# Patient Record
Sex: Male | Born: 1940 | Race: White | Hispanic: No | Smoking: Former smoker
Health system: Southern US, Community
[De-identification: ages and names within clinical notes are randomized; demographics above are authoritative.]

## PROBLEM LIST (undated history)

## (undated) DIAGNOSIS — N400 Enlarged prostate without lower urinary tract symptoms: Secondary | ICD-10-CM

## (undated) DIAGNOSIS — I10 Essential (primary) hypertension: Secondary | ICD-10-CM

## (undated) HISTORY — PX: SHOULDER ARTHROSCOPY: SHX128

## (undated) HISTORY — PX: CHOLECYSTECTOMY: SHX55

---

## 2005-01-07 ENCOUNTER — Ambulatory Visit: Payer: Self-pay | Admitting: Unknown Physician Specialty

## 2005-01-20 ENCOUNTER — Ambulatory Visit: Payer: Self-pay | Admitting: Unknown Physician Specialty

## 2005-01-28 ENCOUNTER — Ambulatory Visit: Payer: Self-pay | Admitting: Surgery

## 2006-05-13 IMAGING — NM NUCLEAR MEDICINE HEPATOHBILIARY INCLUDE GB
2 series · 12 of 12 positions shown · non-contrast
Comparison: none

REASON FOR EXAM: Abd Pain Epigastric Pain  US Done January 07, 2005
COMMENTS:

[Series 1: gallbladder dynamic · 4.80mm/px · 6 of 60 frames shown]
[frame 6/60]
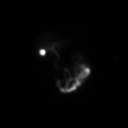
[frame 16/60]
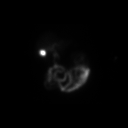
[frame 26/60]
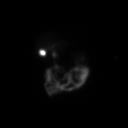
[frame 36/60]
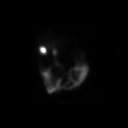
[frame 46/60]
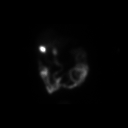
[frame 56/60]
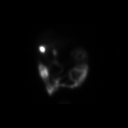

[Series 1: gallbladder dynamic (results) · 4.80mm/px · 6 of 60 frames shown]
[frame 6/60]
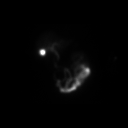
[frame 16/60]
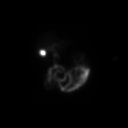
[frame 26/60]
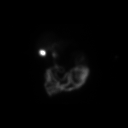
[frame 36/60]
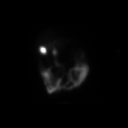
[frame 46/60]
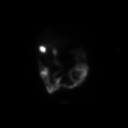
[frame 56/60]
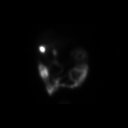

[12 of 12 positions shown; findings below may reference images not displayed]

PROCEDURE:     NM  - NM HEPATO WITH GB EJECT FRACTION  - January 20, 2005 [DATE]

RESULT:       After injection of 7.69 mCi Tc 99m Choletec, multiple gamma
scintiphotos were obtained of the abdomen.  There is noted tracer faintly
appearing in the gallbladder at 45 minutes and within the small bowel at
approximately 45 minutes.  Post injection of Sincalide there is noted a 28%
ejection fraction at 27 minutes.   This is below the normal range.
IMPRESSION: Abnormal gallbladder ejection fraction.

## 2007-04-08 ENCOUNTER — Ambulatory Visit: Payer: Self-pay | Admitting: Emergency Medicine

## 2007-06-16 ENCOUNTER — Ambulatory Visit: Payer: Self-pay | Admitting: Cardiology

## 2007-08-31 ENCOUNTER — Ambulatory Visit: Payer: Self-pay | Admitting: Unknown Physician Specialty

## 2007-11-09 ENCOUNTER — Emergency Department: Payer: Self-pay | Admitting: Emergency Medicine

## 2007-11-19 ENCOUNTER — Ambulatory Visit: Payer: Self-pay | Admitting: Specialist

## 2007-11-23 ENCOUNTER — Ambulatory Visit: Payer: Self-pay | Admitting: Specialist

## 2009-02-01 ENCOUNTER — Ambulatory Visit: Payer: Self-pay | Admitting: Internal Medicine

## 2009-03-01 IMAGING — CR DG HUMERUS 2V *L*
1 series · 2 of 2 positions shown · non-contrast
Comparison: none

REASON FOR EXAM: Pain status post fall, eval for fracture.
COMMENTS:

PROCEDURE:     DXR - DXR HUMERUS LEFT  - November 09, 2007  [DATE]
RESULT:     No acute bony or joint abnormality is identified.

[Series 1: view not recorded · 0.17mm/px · 2 of 2 slices shown]
[im 1/2]
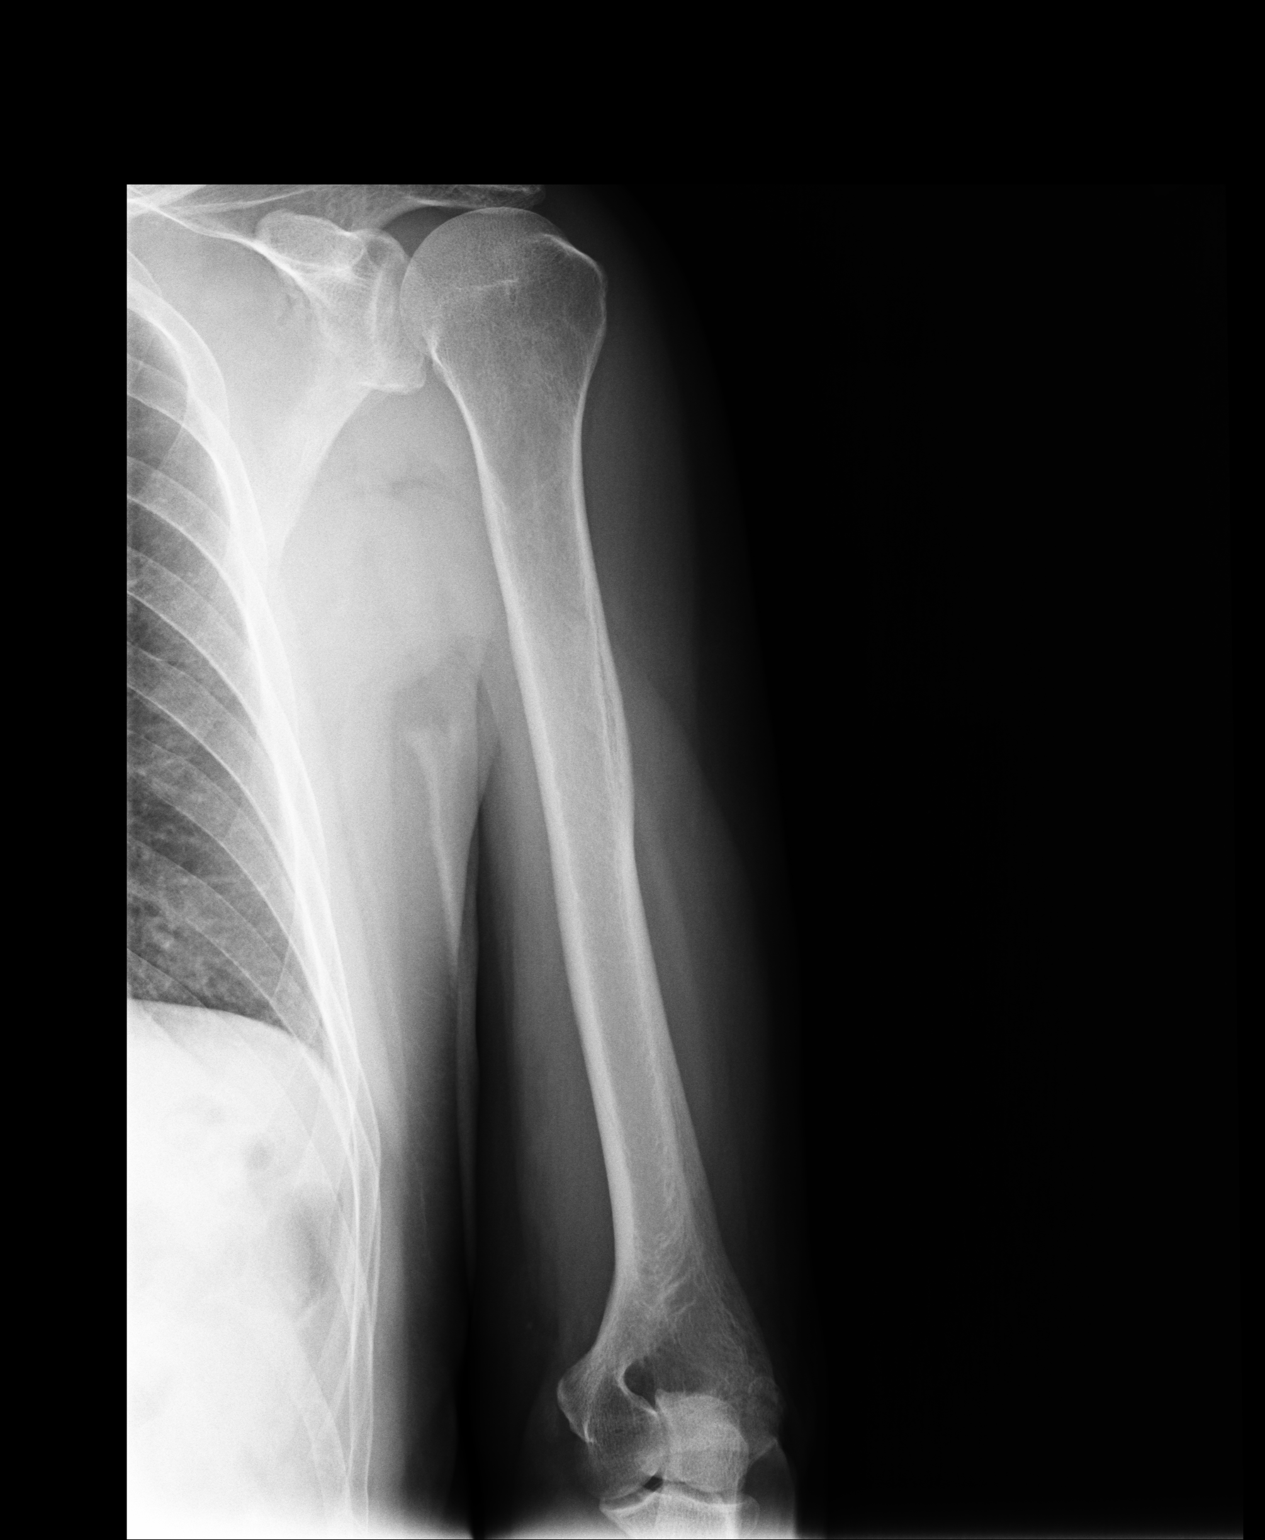
[im 2/2]
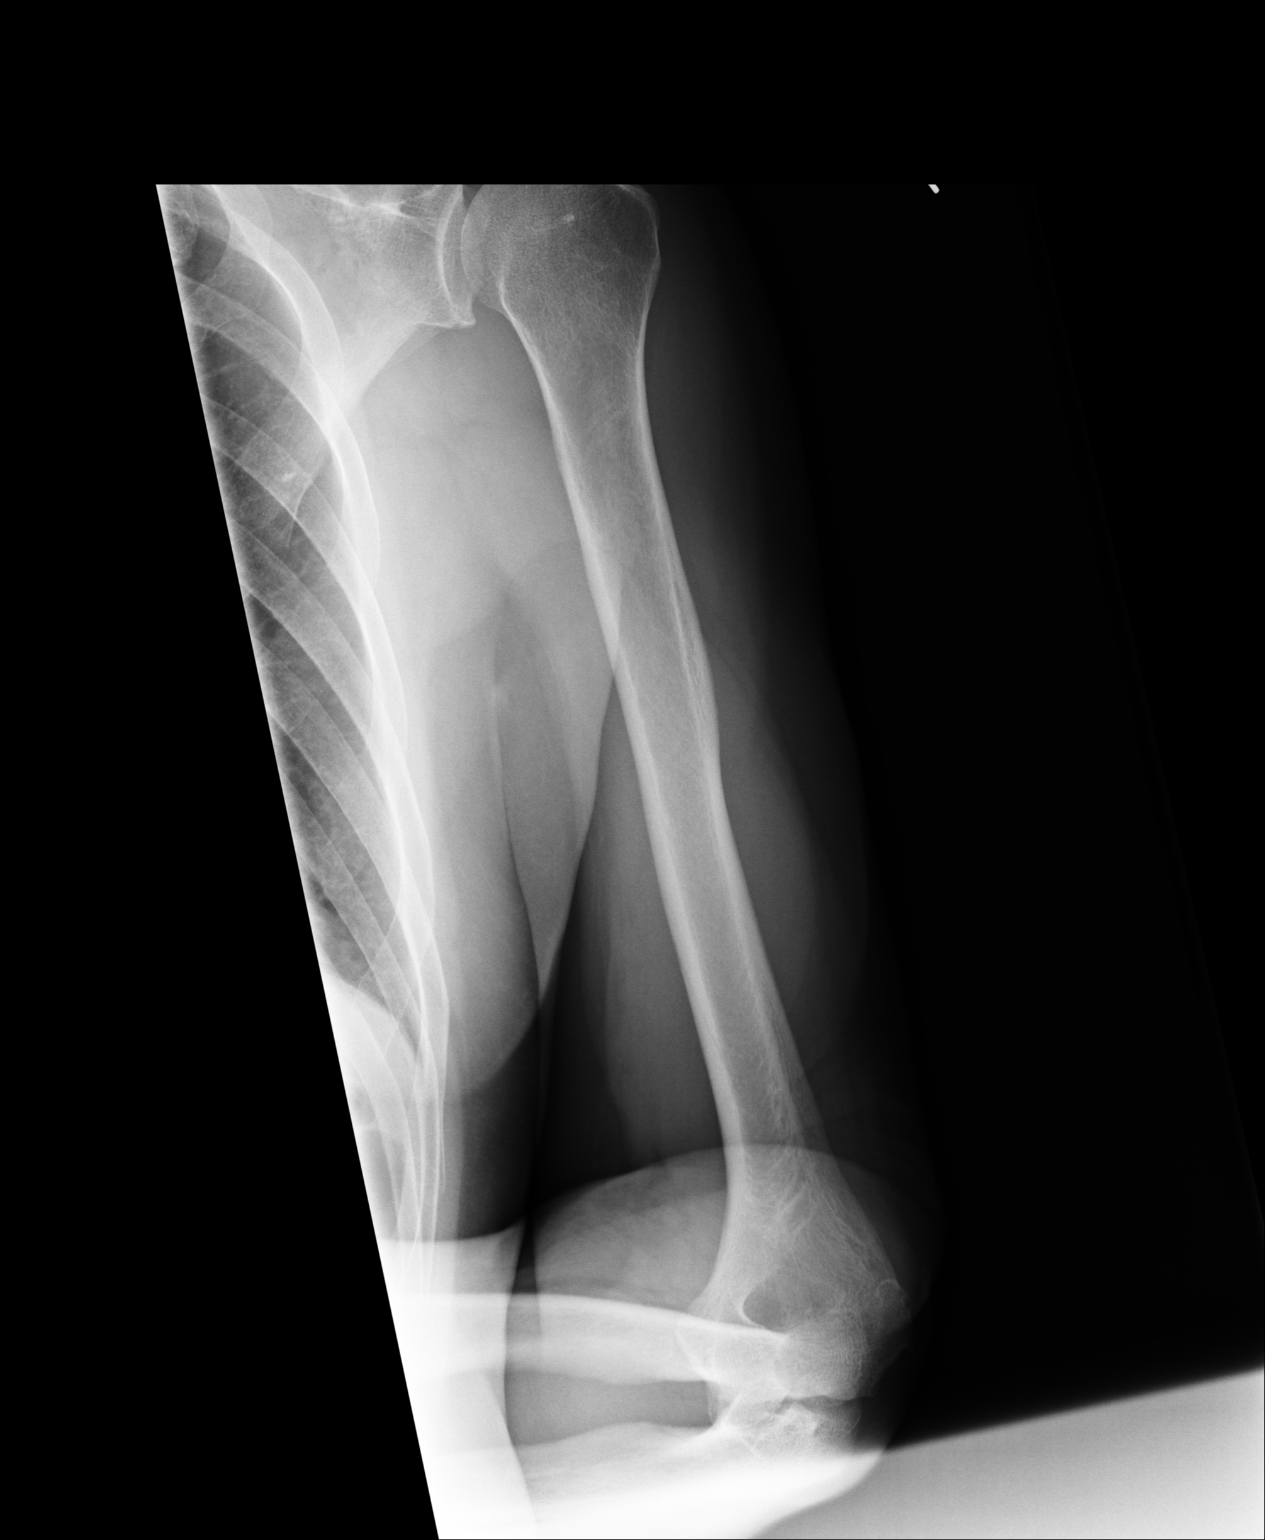

[2 of 2 positions shown; findings below may reference images not displayed]

IMPRESSION: No acute bony or joint abnormalities.

## 2009-03-09 ENCOUNTER — Emergency Department: Payer: Self-pay | Admitting: Internal Medicine

## 2009-06-12 ENCOUNTER — Ambulatory Visit: Payer: Self-pay | Admitting: Family Medicine

## 2009-10-17 ENCOUNTER — Ambulatory Visit: Payer: Self-pay | Admitting: Family Medicine

## 2011-06-23 DIAGNOSIS — M1990 Unspecified osteoarthritis, unspecified site: Secondary | ICD-10-CM | POA: Insufficient documentation

## 2011-10-05 ENCOUNTER — Ambulatory Visit: Payer: Self-pay

## 2011-10-07 ENCOUNTER — Ambulatory Visit: Payer: Self-pay

## 2012-11-19 ENCOUNTER — Ambulatory Visit: Payer: Self-pay | Admitting: Emergency Medicine

## 2013-06-27 ENCOUNTER — Ambulatory Visit: Payer: Self-pay | Admitting: Family Medicine

## 2013-11-15 ENCOUNTER — Ambulatory Visit: Payer: Self-pay | Admitting: Unknown Physician Specialty

## 2013-11-17 LAB — PATHOLOGY REPORT

## 2014-01-09 ENCOUNTER — Ambulatory Visit: Payer: Self-pay | Admitting: Family Medicine

## 2014-04-09 ENCOUNTER — Ambulatory Visit: Payer: Self-pay

## 2014-06-22 DIAGNOSIS — I63511 Cerebral infarction due to unspecified occlusion or stenosis of right middle cerebral artery: Secondary | ICD-10-CM | POA: Insufficient documentation

## 2014-06-22 DIAGNOSIS — M25512 Pain in left shoulder: Secondary | ICD-10-CM | POA: Insufficient documentation

## 2015-04-19 ENCOUNTER — Ambulatory Visit
Admission: EM | Admit: 2015-04-19 | Discharge: 2015-04-19 | Disposition: A | Discharge status: Home / Self Care | Payer: Medicare HMO | Attending: Family Medicine | Admitting: Family Medicine

## 2015-04-19 DIAGNOSIS — L03113 Cellulitis of right upper limb: Secondary | ICD-10-CM

## 2015-04-19 DIAGNOSIS — Z23 Encounter for immunization: Secondary | ICD-10-CM

## 2015-04-19 HISTORY — DX: Essential (primary) hypertension: I10

## 2015-04-19 HISTORY — DX: Benign prostatic hyperplasia without lower urinary tract symptoms: N40.0

## 2015-04-19 MED ORDER — SULFAMETHOXAZOLE-TRIMETHOPRIM 800-160 MG PO TABS: 1.0000 | ORAL_TABLET | Freq: Two times a day (BID) | ORAL | Status: AC

## 2015-04-19 MED ORDER — TETANUS-DIPHTH-ACELL PERTUSSIS 5-2.5-18.5 LF-MCG/0.5 IM SUSP
0.5000 mL | Freq: Once | INTRAMUSCULAR | Status: AC
  Administered 2015-04-19: 0.5 mL via INTRAMUSCULAR

## 2015-04-19 NOTE — ED Provider Notes (Addendum)
CSN: [id number]     Arrival date & time 04/19/15  1919 History   First MD Initiated Contact with Patient 04/19/15 1936     Chief Complaint  Patient presents with  . Cellulitis   (Consider location/radiation/quality/duration/timing/severity/associated sxs/prior Treatment) HPI 74 year old male presents to clinic for evaluation of questionable cellulitis. He sustained an injury from a nail penetrating the medial aspect of his right wrist yesterday and is now experiencing an area of redness and swelling around the wound. He denies any numbness or tingling distally. No loss of function of the wrist or hand. Last tetanus booster was approximately 8 years ago. Past Medical History  Diagnosis Date  . Hypertension   . BPH (benign prostatic hyperplasia)    No past surgical history on file. No family history on file. History  Substance Use Topics  . Smoking status: Former Smoker -- 2.00 packs/day for 40 years    Quit date: 10/25/1990  . Smokeless tobacco: Never Used  . Alcohol Use: Yes     Comment: occasional    Review of Systems A complete 12 point review of systems was performed and are negative with the exception of what is documented in the history of present illness. Allergies  Narcan and Penicillins  Home Medications   Prior to Admission medications   Medication Sig Start Date End Date Taking? Authorizing Provider  aspirin 81 MG tablet Take 81 mg by mouth daily.   Yes Historical Provider, MD  Cyanocobalamin (VITAMIN B-12 IJ) Inject as directed.   Yes Historical Provider, MD  metoprolol succinate (TOPROL-XL) 25 MG 24 hr tablet Take 25 mg by mouth daily.   Yes Historical Provider, MD  omeprazole (PRILOSEC) 20 MG capsule Take 20 mg by mouth daily.   Yes Historical Provider, MD  oxyCODONE-acetaminophen (PERCOCET) 10-325 MG per tablet Take 1 tablet by mouth every 8 (eight) hours as needed for pain.   Yes Historical Provider, MD  tamsulosin (FLOMAX) 0.4 MG CAPS capsule Take 0.4 mg by  mouth.   Yes Historical Provider, MD  traZODone (DESYREL) 50 MG tablet Take 25 mg by mouth at bedtime.   Yes Historical Provider, MD  sulfamethoxazole-trimethoprim (BACTRIM DS,SEPTRA DS) 800-160 MG per tablet Take 1 tablet by mouth 2 (two) times daily. 04/19/15 04/26/15  Carlean Purl, PA-C   BP 126/74 mmHg  Pulse 61  Temp(Src) 97.8 F (36.6 C) (Oral)  Ht 5\' 6"  (1.676 m)  Wt 150 lb (68.04 kg)  BMI 24.22 kg/m2  SpO2 99% Physical Exam Gen.: Well-developed, well-nourished, no acute distress. Behavioral: Alert and oriented 3, answers questions appropriately. Skin: Warm, dry, intact, with the exception of a puncture wound at the medial right wrist with a 6 cm x 8 cm area of erythema and swelling. ED Course  Procedures (including critical care time) Labs Review Labs Reviewed - No data to display  Imaging Review No results found.   MDM   1. Cellulitis of right upper extremity    Plan: 1. diagnosis reviewed with patient 2. rx as per orders; risks, benefits, potential side effects reviewed with patient 3. Recommend supportive treatment with observation to ensure redness is not spreading. 4. F/u prn if symptoms worsen or don't improve     Carlean Purl, PA-C 04/19/15 2001  19:51 Medication Given ED  Tdap (BOOSTRIX) injection 0.5 mL - Dose: 0.5 mL ; Route: Intramuscular ; Site: Left Deltoid ; Scheduled Time: 2000       Carlean Purl, PA-C 06/04/15 1832

## 2015-04-19 NOTE — Discharge Instructions (Signed)
Return if redness is spreading or you develop a fever.  Complete antibiotics in their entirety.

## 2015-04-19 NOTE — ED Notes (Signed)
Pt reports he received an abrasion after hitting his right wrist against a "rusty nail". The affected is painful, erythematous, swollen, as well as warm to the touch. Pt states he received a Td immunization about 8 years ago, and is questioning if he needs another one.

## 2018-01-10 ENCOUNTER — Ambulatory Visit
Admission: EM | Admit: 2018-01-10 | Discharge: 2018-01-10 | Disposition: A | Discharge status: Home / Self Care | Payer: Medicare HMO | Attending: Family Medicine | Admitting: Family Medicine

## 2018-01-10 ENCOUNTER — Other Ambulatory Visit: Payer: Self-pay

## 2018-01-10 ENCOUNTER — Ambulatory Visit (INDEPENDENT_AMBULATORY_CARE_PROVIDER_SITE_OTHER): Payer: Medicare HMO

## 2018-01-10 DIAGNOSIS — M25512 Pain in left shoulder: Secondary | ICD-10-CM | POA: Diagnosis not present

## 2018-01-10 DIAGNOSIS — M25511 Pain in right shoulder: Secondary | ICD-10-CM

## 2018-01-10 DIAGNOSIS — W19XXXA Unspecified fall, initial encounter: Secondary | ICD-10-CM

## 2018-01-10 DIAGNOSIS — S46819A Strain of other muscles, fascia and tendons at shoulder and upper arm level, unspecified arm, initial encounter: Secondary | ICD-10-CM

## 2018-01-10 NOTE — Discharge Instructions (Signed)
Take home medication as prescribed. Rest. Drink plenty of fluids.   Follow up with your primary care physician or orthopedic this week as needed. Return to Urgent care for new or worsening concerns.

## 2018-01-10 NOTE — ED Triage Notes (Signed)
Patient complains of a fall that occurred yesterday at [employer]. Patient states that he fell and put his arms straight out and now complains of bilateral shoulder pain. Patient states that he also has neck pain.

## 2018-01-10 NOTE — ED Provider Notes (Signed)
[facility] ____________________________________________  Time seen: Approximately 9:49 PM  I have reviewed the triage vital signs and the nursing notes.   HISTORY  Chief Complaint Fall and Shoulder Pain (bilateral)   HPI Matthew Hood is a 77 y.o. male presenting for evaluation of bilateral shoulder pain since yesterday after he had a fall.  States he was walking to the car at [employer] parking lot.  States he missed-stepped off of the sidewalk causing him to trip and fall forward.  States he was able to get his hands up and catch himself.  States that he hit his knees first and then his hands.  States since the fall he has had some bilateral shoulder pain.  States he still has normal range of motion for his shoulders, but states as he said previous surgery on both shoulders it was recommended for him to have it evaluated and make sure no fractures.  Denies acute decreased range of motion.  Reports soreness in bilateral shoulders as well as bilateral neck.  Denies any pain to actual neck with movement of neck.  Denies chest pain shortness of breath, syncope, abdominal pain or knee pain.  Denies head injury or loss of consciousness.  Did not hit face on concrete.  His continue remain ambulatory.  States that he did take his chronic oxycodone yesterday and today which helped.  Denies other aggravating or alleviating factors.  Reports otherwise feels well denies other complaints.  Denies recent sickness. Denies recent antibiotic use.   Follows with [facility].  Past Medical History:  Diagnosis Date  . BPH (benign prostatic hyperplasia)   . Hypertension     There are no active problems to display for this patient.   Past Surgical History:  Procedure Laterality Date  . CHOLECYSTECTOMY    . SHOULDER ARTHROSCOPY       No current facility-administered medications for this encounter.   Current Outpatient Medications:  .  aspirin 81 MG tablet, Take 81 mg by mouth  daily., Disp: , Rfl:  .  Cyanocobalamin (VITAMIN B-12 IJ), Inject as directed., Disp: , Rfl:  .  omeprazole (PRILOSEC) 20 MG capsule, Take 20 mg by mouth daily., Disp: , Rfl:  .  oxyCODONE-acetaminophen (PERCOCET) 10-325 MG per tablet, Take 1 tablet by mouth every 8 (eight) hours as needed for pain., Disp: , Rfl:  .  tamsulosin (FLOMAX) 0.4 MG CAPS capsule, Take 0.4 mg by mouth., Disp: , Rfl:  .  traZODone (DESYREL) 50 MG tablet, Take 25 mg by mouth at bedtime., Disp: , Rfl:   Allergies Narcan [naloxone hcl] and Penicillins  Family History  Problem Relation Age of Onset  . Heart disease Mother   . Heart attack Father     Social History Social History   Tobacco Use  . Smoking status: Former Smoker    Packs/day: 2.00    Years: 40.00    Pack years: 80.00    Last attempt to quit: 10/25/1990    Years since quitting: 27.2  . Smokeless tobacco: Never Used  Substance Use Topics  . Alcohol use: Yes    Comment: occasional  . Drug use: No    Review of Systems Constitutional: No fever/chills Cardiovascular: Denies chest pain. Respiratory: Denies shortness of breath. Gastrointestinal: No abdominal pain.   Musculoskeletal: Negative for back pain. As above.  Skin: Negative for rash.   ____________________________________________   PHYSICAL EXAM:  VITAL SIGNS: ED Triage Vitals  Enc Vitals Group     BP 01/10/18 0855 134/69  Pulse Rate 01/10/18 0855 60     Resp 01/10/18 0855 17     Temp 01/10/18 0855 97.6 F (36.4 C)     Temp Source 01/10/18 0855 Oral     SpO2 01/10/18 0855 98 %     Weight 01/10/18 0852 152 lb (68.9 kg)     Height 01/10/18 0852 5\' 6"  (1.676 m)     Head Circumference --      Peak Flow --      Pain Score 01/10/18 0852 5     Pain Loc --      Pain Edu? --      Excl. in GC? --     Constitutional: Alert and oriented. Well appearing and in no acute distress. ENT      Head: Normocephalic and atraumatic. Cardiovascular: Normal rate, regular rhythm.  Grossly normal heart sounds.  Good peripheral circulation. Respiratory: Normal respiratory effort without tachypnea nor retractions. Breath sounds are clear and equal bilaterally. No wheezes, rales, rhonchi. Musculoskeletal:   No midline cervical, thoracic or lumbar tenderness to palpation. Bilateral distal radial pulses equal and easily palpated.  Bilateral upper extremities nontender except for below.  Bilateral hand grip strong and equal.      Right lower leg:  No tenderness or edema.      Left lower leg:  No tenderness or edema.  Except: Left anterior lateral shoulder mild diffuse tenderness to palpation, patient able to abduct to 90 degrees, not much higher, no pain with empty can test, negative drop arm test, patient reports full range of motion for him, no ecchymosis, no obvious deformity, previous surgical scars. Except: Right anterior proximal humerus very mild tenderness to direct palpation, right shoulder otherwise nontender, able to abduct greater than 90 degrees, negative drop arm test, negative empty can test. Except: Bilateral trapezius mild tenderness to palpation, no midline cervical tenderness, and no pain with cervical flexion, extension or rotation. Neurologic:  Normal speech and language. No gross focal neurologic deficits are appreciated. Speech is normal. No gait instability.  Skin:  Skin is warm, dry and intact. No rash noted. Psychiatric: Mood and affect are normal. Speech and behavior are normal. Patient exhibits appropriate insight and judgment   ___________________________________________   LABS (all labs ordered are listed, but only abnormal results are displayed)  Labs Reviewed - No data to display ____________________________________________  RADIOLOGY  Dg Shoulder Right  Result Date: 01/10/2018 CLINICAL DATA:  Pt states he fell yesterday after missing a step. Pain in both shoulders from catching himself. Pt states he is having more ant prox humeral tightness in  bilat shoulder than pain. Hx of mult shoulder surgeries both sides EXAM: RIGHT SHOULDER - 2+ VIEW COMPARISON:  None. FINDINGS: Glenohumeral joint is intact. No evidence of scapular fracture or humeral fracture. The acromioclavicular joint is intact. Tendon anchor noted IMPRESSION: No fracture or dislocation. Electronically Signed   By: Genevive BiStewart  Edmunds M.D.   On: 01/10/2018 10:41   Dg Shoulder Left  Result Date: 01/10/2018 CLINICAL DATA:  Status post fall, left shoulder replacement EXAM: LEFT SHOULDER - 2+ VIEW COMPARISON:  None. FINDINGS: Interval total reverse left shoulder arthroplasty. No hardware failure or complication. Normal alignment. No acute fracture or dislocation. 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 No acute osseous injury of the left shoulder. Electronically Signed   By: Elige KoHetal  Patel   On: 01/10/2018 10:34   ____________________________________________   PROCEDURES Procedures   INITIAL IMPRESSION / ASSESSMENT AND PLAN / ED COURSE  Pertinent labs & imaging results that were available during my care of the patient were reviewed by me and considered in my medical decision making (see chart for details).  Well-appearing patient.  No acute distress.  Patient had a mechanical fall yesterday at [facility].  Catching himself with his arms, now complaining of bilateral shoulder pain in which he has had bilateral previous shoulder surgery.  denies head injury.  Bilateral shoulders x-rayed, results as above and reviewed by myself, no fracture, dislocation or acute osseous changes.  Encourage rest, ice, supportive care and supportive management.  Patient has chronic pain medication at home as needed.  X-ray copies given.  recommend follow-up with orthopedic as needed for continued pain.  Discussed follow up with Primary care physician this week. Discussed follow up and return parameters including no resolution or any worsening concerns. Patient  verbalized understanding and agreed to plan.   ____________________________________________   FINAL CLINICAL IMPRESSION(S) / ED DIAGNOSES  Final diagnoses:  Pain of both shoulder joints  Strain of trapezius muscle, unspecified laterality, initial encounter     ED Discharge Orders    None       Note: This dictation was prepared with Dragon dictation along with smaller phrase technology. Any transcriptional errors that result from this process are unintentional.         Renford Dills, NP 01/10/18 1359

## 2018-07-27 ENCOUNTER — Other Ambulatory Visit: Payer: Self-pay | Admitting: Internal Medicine

## 2018-07-27 DIAGNOSIS — R2242 Localized swelling, mass and lump, left lower limb: Principal | ICD-10-CM

## 2018-07-27 DIAGNOSIS — M7989 Other specified soft tissue disorders: Secondary | ICD-10-CM

## 2018-07-27 DIAGNOSIS — J029 Acute pharyngitis, unspecified: Secondary | ICD-10-CM

## 2019-03-31 ENCOUNTER — Other Ambulatory Visit: Payer: Self-pay | Admitting: Family Medicine

## 2019-03-31 DIAGNOSIS — M5412 Radiculopathy, cervical region: Secondary | ICD-10-CM

## 2019-04-12 ENCOUNTER — Ambulatory Visit: Admission: RE | Admit: 2019-04-12 | Payer: Medicare HMO | Source: Ambulatory Visit

## 2019-05-01 ENCOUNTER — Ambulatory Visit: Payer: Medicare HMO

## 2019-09-18 DIAGNOSIS — G894 Chronic pain syndrome: Secondary | ICD-10-CM | POA: Insufficient documentation

## 2021-08-25 DIAGNOSIS — G629 Polyneuropathy, unspecified: Secondary | ICD-10-CM | POA: Insufficient documentation

## 2022-08-06 DIAGNOSIS — G629 Polyneuropathy, unspecified: Secondary | ICD-10-CM | POA: Insufficient documentation

## 2022-10-01 ENCOUNTER — Other Ambulatory Visit: Payer: Self-pay | Admitting: Otolaryngology

## 2022-10-01 DIAGNOSIS — H9193 Unspecified hearing loss, bilateral: Secondary | ICD-10-CM

## 2022-11-04 DIAGNOSIS — F321 Major depressive disorder, single episode, moderate: Secondary | ICD-10-CM | POA: Insufficient documentation

## 2024-05-11 DIAGNOSIS — M545 Low back pain, unspecified: Secondary | ICD-10-CM | POA: Insufficient documentation

## 2024-07-13 ENCOUNTER — Encounter: Payer: Self-pay | Admitting: Student in an Organized Health Care Education/Training Program

## 2024-07-13 ENCOUNTER — Ambulatory Visit
Attending: Student in an Organized Health Care Education/Training Program | Admitting: Student in an Organized Health Care Education/Training Program

## 2024-07-13 VITALS — BP 131/77 | HR 66 | Temp 98.7°F | Resp 16 | Ht 66.0 in | Wt 140.0 lb

## 2024-07-13 DIAGNOSIS — F321 Major depressive disorder, single episode, moderate: Secondary | ICD-10-CM | POA: Insufficient documentation

## 2024-07-13 DIAGNOSIS — M25512 Pain in left shoulder: Secondary | ICD-10-CM | POA: Insufficient documentation

## 2024-07-13 DIAGNOSIS — G629 Polyneuropathy, unspecified: Secondary | ICD-10-CM | POA: Diagnosis not present

## 2024-07-13 DIAGNOSIS — G8929 Other chronic pain: Secondary | ICD-10-CM | POA: Diagnosis present

## 2024-07-13 DIAGNOSIS — G894 Chronic pain syndrome: Secondary | ICD-10-CM | POA: Diagnosis present

## 2024-07-13 DIAGNOSIS — I639 Cerebral infarction, unspecified: Secondary | ICD-10-CM | POA: Insufficient documentation

## 2024-07-13 NOTE — Progress Notes (Signed)
 Safety precautions to be maintained throughout the outpatient stay will include: orient to surroundings, keep bed in low position, maintain call bell within reach at all times, provide assistance with transfer out of bed and ambulation.

## 2024-07-13 NOTE — Progress Notes (Signed)
 PROVIDER NOTE: Interpretation of information contained herein should be left to medically-trained personnel. Specific patient instructions are provided elsewhere under Patient Instructions section of medical record. This document was created in part using AI and STT-dictation technology, any transcriptional errors that may result from this process are unintentional.  Patient: Matthew Hood  Service: E/M Encounter  Provider: Wallie Sherry, MD  DOB: [birth year]  Delivery: Face-to-face  Specialty: Interventional Pain Management  MRN: [id number]  Setting: Ambulatory outpatient facility  Specialty designation: 09  Type: New Patient  Location: Outpatient office facility  PCP: Matthew Loader, MD  DOS: 07/13/2024    Referring Prov.: Matthew Redbird, MD   Primary Reason(s) for Visit: Encounter for initial evaluation of one or more chronic problems (new to examiner) potentially causing chronic pain, and posing a threat to normal musculoskeletal function. (Level of risk: High) CC: Leg Pain (Bilateral, PN going up towards the knees )  HPI  Matthew Hood is a 83 y.o. year old, male patient, who comes for the first time to our practice referred by Matthew Redbird, MD for our initial evaluation of his chronic pain. He has Acute low back pain without sciatica; Acute right MCA stroke (HCC); Cerebrovascular accident [facility]); Chronic pain syndrome; Current moderate episode of major depressive disorder without prior episode (HCC); Osteoarthritis; Pain in left shoulder; Small fiber neuropathy; and Sensory neuropathy on their problem list. Today he comes in for evaluation of his Leg Pain (Bilateral, PN going up towards the knees )  Pain Assessment: Location: Right, Left Leg Radiating: PN radiating up towards the knees Onset: More than a month ago Duration: Chronic pain Quality: Tingling, Cramping, Discomfort Severity: 5 /10 (subjective, self-reported pain score)  Effect on ADL: sleep disruption Timing:   Modifying factors: pain  medications BP: 131/77  HR: 66  Onset and Duration: Gradual and Present longer than 3 months Cause of pain: Unknown Severity: No change since onset and NAS-11 on the average: 10/10 Timing: Not influenced by the time of the day Aggravating Factors: na Alleviating Factors: Medications Associated Problems: Night-time cramps, Spasms, Tingling, and Pain that wakes patient up Quality of Pain: Aching, Agonizing, Annoying, Deep, Exhausting, and Nagging Previous Examinations or Tests: The patient denies na Previous Treatments: The patient denies NA   Matthew Hood is being evaluated for possible interventional pain management therapies for the treatment of his chronic pain.  Discussed the use of AI scribe software for clinical note transcription with the patient, who gave verbal consent to proceed.  History of Present Illness   Matthew Hood is an 83 year old male who presents for management of chronic leg pain.  He has been experiencing chronic leg pain and was previously under the care of a headache specialist who is retiring in December. He has been using oxycodone for pain management and is attempting to reduce his usage. He currently takes 5 mg of oxycodone once a day, sometimes less, depending on the severity of the pain. The pain is described as severe enough at times to interfere with his activities, stating 'I can't do nothing cause he, he, he gets in my way.'  He has a history of stroke, which occurred around 46. He states that the stroke did not have significant lasting effects, other than slowing him down and making him 'think a little better.'  He mentions taking medication for his stomach since childhood and was referred by his primary doctor to another specialist for an endoscopy to investigate issues related to vitamin and iron absorption. However,  he has not been contacted for the procedure, which was supposed to be scheduled a month ago.       Historic Controlled Substance  Pharmacotherapy Review PMP and historical list of controlled substances:  06/26/2024 06/12/2024  1 Oxycodone-Acetaminophen 10-325 60.00 30 Al Fin [id number] Nor ([id number]) 0/0 30.00 MME Medicare [place]  05/26/2024 05/26/2024  1 Oxycodone-Acetaminophen 10-325 90.00 30 Al Fin [id number] Nor ([id number]) 0/0 45.00 MME Medicare [place]    Historical Monitoring: The patient  reports no history of drug use. List of prior UDS Testing: No results found for: MDMA, COCAINSCRNUR, PCPSCRNUR, PCPQUANT, CANNABQUANT, THCU, ETH, CBDTHCR, D8THCCBX, D9THCCBX Historical Background Evaluation: [place] PMP: PDMP reviewed during this encounter. Review of the past 35-months conducted.              [place] Department of public safety, offender search: Engineer, mining Information) Non-contributory Risk Assessment Profile: Aberrant behavior: None observed or detected today Risk factors for fatal opioid overdose: None identified today Fatal overdose hazard ratio (HR): Calculation deferred Non-fatal overdose hazard ratio (HR): Calculation deferred Risk of opioid abuse or dependence: 0.7-3.0% with doses <= 36 MME/day and 6.1-26% with doses >= 120 MME/day. Substance use disorder (SUD) risk level: Low  Pharmacologic Plan: As per protocol, I have not taken over any controlled substance management, pending the results of ordered tests and/or consults.            Initial impression: Pending review of available data and ordered tests.  Meds   Current Outpatient Medications:    Cyanocobalamin (VITAMIN B-12 IJ), Inject as directed., Disp: , Rfl:    cyanocobalamin (VITAMIN B12) 1000 MCG/ML injection, Inject 1,000 mcg into the muscle every 30 (thirty) days., Disp: , Rfl:    ferrous sulfate 325 (65 FE) MG EC tablet, Take 325 mg by mouth daily with breakfast., Disp: , Rfl:    naloxone (NARCAN) nasal spray 4 mg/0.1 mL, Place 1 spray into the nose once., Disp: , Rfl:    omeprazole (PRILOSEC) 20 MG capsule, Take 20 mg by mouth daily., Disp: , Rfl:     oxyCODONE-acetaminophen (PERCOCET) 10-325 MG per tablet, Take 1 tablet by mouth every 8 (eight) hours as needed for pain., Disp: , Rfl:    tamsulosin (FLOMAX) 0.4 MG CAPS capsule, Take 0.4 mg by mouth., Disp: , Rfl:    traZODone (DESYREL) 50 MG tablet, Take 25 mg by mouth at bedtime., Disp: , Rfl:    aspirin 81 MG tablet, Take 81 mg by mouth daily. (Patient not taking: Reported on 07/13/2024), Disp: , Rfl:   Imaging Review  DG Shoulder Right  Narrative CLINICAL DATA:  Pt states he fell yesterday after missing a step. Pain in both shoulders from catching himself. Pt states he is having more ant prox humeral tightness in bilat shoulder than pain. Hx of mult shoulder surgeries both sides  EXAM: RIGHT SHOULDER - 2+ VIEW  COMPARISON:  None.  FINDINGS: Glenohumeral joint is intact. No evidence of scapular fracture or humeral fracture. The acromioclavicular joint is intact. Tendon anchor noted  IMPRESSION: No fracture or dislocation.   Electronically Signed By: Jackquline Boxer M.D. On: 01/10/2018 10:41    Narrative CLINICAL DATA:  Status post fall, left shoulder replacement  EXAM: LEFT SHOULDER - 2+ VIEW  COMPARISON:  None.  FINDINGS: Interval total reverse left shoulder arthroplasty. No hardware failure or complication. Normal alignment. No acute fracture or dislocation.  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 No acute osseous injury of the left shoulder.  Electronically Signed By: Julaine Blanch On: 01/10/2018 10:34    Complexity Note: Imaging results reviewed.                         ROS  Cardiovascular: No reported cardiovascular signs or symptoms such as High blood pressure, coronary artery disease, abnormal heart rate or rhythm, heart attack, blood thinner therapy or heart weakness and/or failure Pulmonary or Respiratory: No reported pulmonary signs or symptoms such as wheezing and difficulty taking a deep  full breath (Asthma), difficulty blowing air out (Emphysema), coughing up mucus (Bronchitis), persistent dry cough, or temporary stoppage of breathing during sleep Neurological: No reported neurological signs or symptoms such as seizures, abnormal skin sensations, urinary and/or fecal incontinence, being born with an abnormal open spine and/or a tethered spinal cord Psychological-Psychiatric: Difficulty sleeping and or falling asleep Gastrointestinal: Reflux or heatburn Genitourinary: No reported renal or genitourinary signs or symptoms such as difficulty voiding or producing urine, peeing blood, non-functioning kidney, kidney stones, difficulty emptying the bladder, difficulty controlling the flow of urine, or chronic kidney disease Hematological: Weakness due to low blood hemoglobin or red blood cell count (Anemia) Endocrine: No reported endocrine signs or symptoms such as high or low blood sugar, rapid heart rate due to high thyroid levels, obesity or weight gain due to slow thyroid or thyroid disease Rheumatologic: No reported rheumatological signs and symptoms such as fatigue, joint pain, tenderness, swelling, redness, heat, stiffness, decreased range of motion, with or without associated rash Musculoskeletal: Negative for myasthenia gravis, muscular dystrophy, multiple sclerosis or malignant hyperthermia Work History: Retired  Allergies  Mr. Corpus is allergic to narcan [naloxone hcl] and penicillins.  Laboratory Chemistry Profile   Renal No results found for: BUN, CREATININE, LABCREA, BCR, GFR, GFRAA, GFRNONAA, SPECGRAV, PHUR, PROTEINUR   Electrolytes No results found for: NA, K, CL, CALCIUM, MG, PHOS   Hepatic No results found for: AST, ALT, ALBUMIN, ALKPHOS, AMYLASE, LIPASE, AMMONIA   ID No results found for: LYMEIGGIGMAB, HIV, SARSCOV2NAA, STAPHAUREUS, MRSAPCR, HCVAB, PREGTESTUR, RMSFIGG, QFVRPH1IGG, QFVRPH2IGG    Bone No results found for: VD25OH, [id number], CI6874NY7, CI7874NY7, 25OHVITD1, 25OHVITD2, 25OHVITD3, TESTOFREE, TESTOSTERONE   Endocrine No results found for: GLUCOSE, GLUCOSEU, HGBA1C, TSH, FREET4, TESTOFREE, TESTOSTERONE, SHBG, ESTRADIOL, ESTRADIOLPCT, ESTRADIOLFRE, LABPREG, ACTH, CRTSLPL, UCORFRPERLTR, UCORFRPERDAY, CORTISOLBASE   Neuropathy No results found for: VITAMINB12, FOLATE, HGBA1C, HIV   CNS No results found for: COLORCSF, APPEARCSF, RBCCOUNTCSF, WBCCSF, POLYSCSF, LYMPHSCSF, EOSCSF, PROTEINCSF, GLUCCSF, JCVIRUS, CSFOLI, IGGCSF, LABACHR, ACETBL   Inflammation (CRP: Acute  ESR: Chronic) No results found for: CRP, ESRSEDRATE, LATICACIDVEN   Rheumatology No results found for: RF, ANA, LABURIC, URICUR, LYMEIGGIGMAB, LYMEABIGMQN, HLAB27   Coagulation No results found for: INR, LABPROT, APTT, PLT, DDIMER, LABHEMA, VITAMINK1, AT3   Cardiovascular No results found for: BNP, CKTOTAL, CKMB, TROPONINI, HGB, HCT, LABVMA, EPIRU, EPINEPH24HUR, NOREPRU, NOREPI24HUR, DOPARU, DOPAM24HRUR   Screening No results found for: SARSCOV2NAA, COVIDSOURCE, STAPHAUREUS, MRSAPCR, HCVAB, HIV, PREGTESTUR   Cancer No results found for: CEA, CA125, LABCA2   Allergens No results found for: ALMOND, APPLE, ASPARAGUS, AVOCADO, BANANA, BARLEY, BASIL, BAYLEAF, GREENBEAN, LIMABEAN, WHITEBEAN, BEEFIGE, REDBEET, BLUEBERRY, BROCCOLI, CABBAGE, MELON, CARROT, CASEIN, CASHEWNUT, CAULIFLOWER, CELERY     Note: Lab results reviewed.  PFSH  Drug: Mr. Kehl  reports no history of drug use. Alcohol:  reports current alcohol use. Tobacco:  reports that he quit smoking about 33 years ago. His smoking use included cigarettes. He started smoking about 73 years ago. He has a 80 pack-year smoking  history. He has  never used smokeless tobacco. Medical:  has a past medical history of BPH (benign prostatic hyperplasia) and Hypertension. Family: family history includes Heart attack in his father; Heart disease in his mother.  Past Surgical History:  Procedure Laterality Date   CHOLECYSTECTOMY     SHOULDER ARTHROSCOPY     Active Ambulatory Problems    Diagnosis Date Noted   Acute low back pain without sciatica 05/11/2024   Acute right MCA stroke (HCC) 06/22/2014   Cerebrovascular accident (HCC) 07/13/2024   Chronic pain syndrome 09/18/2019   Current moderate episode of major depressive disorder without prior episode (HCC) 11/04/2022   Osteoarthritis 06/23/2011   Pain in left shoulder 06/22/2014   Small fiber neuropathy 08/06/2022   Sensory neuropathy 08/25/2021   Resolved Ambulatory Problems    Diagnosis Date Noted   No Resolved Ambulatory Problems   Past Medical History:  Diagnosis Date   BPH (benign prostatic hyperplasia)    Hypertension    Constitutional Exam  General appearance: Well nourished, well developed, and well hydrated. In no apparent acute distress Vitals:   07/13/24 0852  BP: 131/77  Pulse: 66  Resp: 16  Temp: 98.7 F (37.1 C)  TempSrc: Temporal  SpO2: 100%  Weight: 140 lb (63.5 kg)  Height: 5' 6 (1.676 m)   BMI Assessment: Estimated body mass index is 22.6 kg/m as calculated from the following:   Height as of this encounter: 5' 6 (1.676 m).   Weight as of this encounter: 140 lb (63.5 kg).  BMI interpretation table: BMI level Category Range association with higher incidence of chronic pain  <18 kg/m2 Underweight   18.5-24.9 kg/m2 Ideal body weight   25-29.9 kg/m2 Overweight Increased incidence by 20%  30-34.9 kg/m2 Obese (Class I) Increased incidence by 68%  35-39.9 kg/m2 Severe obesity (Class II) Increased incidence by 136%  >40 kg/m2 Extreme obesity (Class III) Increased incidence by 254%   Patient's current BMI Ideal Body weight  Body mass index is  22.6 kg/m. Ideal body weight: 63.8 kg (140 lb 10.5 oz)   BMI Readings from Last 4 Encounters:  07/13/24 22.60 kg/m  01/10/18 24.53 kg/m  04/19/15 24.21 kg/m   Wt Readings from Last 4 Encounters:  07/13/24 140 lb (63.5 kg)  01/10/18 152 lb (68.9 kg)  04/19/15 150 lb (68 kg)    Psych/Mental status: Alert, oriented x 3 (person, place, & time)       Eyes: PERLA Respiratory: No evidence of acute respiratory distress  Lumbar Spine Area Exam  Skin & Axial Inspection: No masses, redness, or swelling Alignment: Symmetrical Functional ROM: Pain restricted ROM       Stability: No instability detected Muscle Tone/Strength: Functionally intact. No obvious neuro-muscular anomalies detected. Sensory (Neurological): Unimpaired Palpation: No palpable anomalies        Gait & Posture Assessment  Ambulation: Unassisted Gait: Relatively normal for age and body habitus Posture: WNL  Lower Extremity Exam    Side: Right lower extremity  Side: Left lower extremity  Stability: No instability observed          Stability: No instability observed          Skin & Extremity Inspection: Skin color, temperature, and hair growth are WNL. No peripheral edema or cyanosis. No masses, redness, swelling, asymmetry, or associated skin lesions. No contractures.  Skin & Extremity Inspection: Skin color, temperature, and hair growth are WNL. No peripheral edema or cyanosis. No masses, redness, swelling, asymmetry, or associated skin lesions. No contractures.  Functional ROM: Unrestricted ROM                  Functional ROM: Unrestricted ROM                  Muscle Tone/Strength: Functionally intact. No obvious neuro-muscular anomalies detected.  Muscle Tone/Strength: Functionally intact. No obvious neuro-muscular anomalies detected.  Sensory (Neurological): Neurogenic pain pattern        Sensory (Neurological): Neurogenic pain pattern        DTR: Patellar: deferred today Achilles: deferred today Plantar: deferred  today  DTR: Patellar: deferred today Achilles: deferred today Plantar: deferred today  Palpation: No palpable anomalies  Palpation: No palpable anomalies    Assessment  Primary Diagnosis & Pertinent Problem List: The primary encounter diagnosis was Sensory neuropathy. Diagnoses of Small fiber neuropathy, Chronic left shoulder pain, Current moderate episode of major depressive disorder without prior episode (HCC), and Chronic pain syndrome were also pertinent to this visit.  Visit Diagnosis (New problems to examiner): 1. Sensory neuropathy   2. Small fiber neuropathy   3. Chronic left shoulder pain   4. Current moderate episode of major depressive disorder without prior episode (HCC)   5. Chronic pain syndrome    Plan of Care (Initial workup plan)  General Recommendations: The pain condition that the patient suffers from is best treated with a multidisciplinary approach that involves an increase in physical activity to prevent de-conditioning and worsening of the pain cycle, as well as psychological counseling (formal and/or informal) to address the co-morbid psychological affects of pain. Treatment will often involve judicious use of pain medications and interventional procedures to decrease the pain, allowing the patient to participate in the physical activity that will ultimately produce long-lasting pain reductions. The goal of the multidisciplinary approach is to return the patient to a higher level of overall function and to restore their ability to perform activities of daily living.  Patient has been managed with oxycodone for his chronic low back pain, small fiber neuropathy as well as migraines.  His prescribing physician will be retiring at the end of the year and patient is hoping to transition his medication management to a nearby clinic to where he lives.  He states that he takes 5 to 10 mg of oxycodone daily to help manage his pain.  We will obtain a baseline urine toxicology  screen and he will follow-up in January at which point if everything is appropriate we can have him sign a controlled substance agreement and take over medication prescribing.  I informed the patient that prescription will be for Percocet 5 mg up to twice daily as needed but we will discuss further when he follows up in January.  Patient endorsed understanding.   Lab Orders         Compliance Drug Analysis, Ur     Provider-requested follow-up: Return for patient will call to schedule F2F appt prn.  No future appointments. I discussed the assessment and treatment plan with the patient. The patient was provided an opportunity to ask questions and all were answered. The patient agreed with the plan and demonstrated an understanding of the instructions.  Patient advised to call back or seek an in-person evaluation if the symptoms or condition worsens.  I personally spent a total of in the care of the patient today including preparing to see the patient, getting/reviewing separately obtained history, performing a medically appropriate exam/evaluation, counseling and educating, placing orders, and documenting clinical information in the EHR.  Note by: Matthew Sherry, MD (TTS and AI technology used. I apologize for any typographical errors that were not detected and corrected.) Date: 07/13/2024; Time: 10:53 AM

## 2024-07-18 LAB — COMPLIANCE DRUG ANALYSIS, UR

## 2024-10-10 NOTE — Anesthesia Preprocedure Evaluation (Signed)
 Anesthesia Evaluation  Patient identified by MRN, date of birth, ID band Patient awake    Reviewed: Allergy & Precautions, H&P , NPO status , Patient's Chart, lab work & pertinent test results  Airway Mallampati: III  TM Distance: >3 FB Neck ROM: Full    Dental no notable dental hx. (+) Lower Dentures, Upper Dentures   Pulmonary neg pulmonary ROS, former smoker   Pulmonary exam normal breath sounds clear to auscultation       Cardiovascular hypertension, negative cardio ROS Normal cardiovascular exam Rhythm:Regular Rate:Normal  01-14-15 echo INTERPRETATION  NORMAL LEFT VENTRICULAR SYSTOLIC FUNCTION WITH AN ESTIMATED EF = 50 %  NORMAL RIGHT VENTRICULAR SYSTOLIC FUNCTION  MODERATE TRICUSPID VALVE INSUFFICIENCY  MILD MITRAL VALVE INSUFFICIENCY  NO VALVULAR STENOSIS      Neuro/Psych  PSYCHIATRIC DISORDERS  Depression    Hx right MCA stroke 2015,  word finding difficulties at times  Neuromuscular disease CVA negative neurological ROS  negative psych ROS   GI/Hepatic negative GI ROS, Neg liver ROS,,,  Endo/Other  negative endocrine ROS    Renal/GU negative Renal ROS  negative genitourinary   Musculoskeletal negative musculoskeletal ROS (+) Arthritis ,    Abdominal   Peds negative pediatric ROS (+)  Hematology negative hematology ROS (+) Blood dyscrasia, anemia   Anesthesia Other Findings Hypertension BPH (benign prostatic hyperplasia) B12 deficiency Iron deficiency anemia Chronic pain Insomnia Chronic pruritus  Hereditary sensory neuropathy Depression  Low back pain without sciatica Sensory neuropathy  Small fiber neuropathy Disequilibrium syndrome  Acute ischemic right MCA stroke 2015 Mild mitral insufficiency    Reproductive/Obstetrics negative OB ROS                              Anesthesia Physical Anesthesia Plan  ASA: 3  Anesthesia Plan: MAC   Post-op Pain Management:     Induction: Intravenous  PONV Risk Score and Plan:   Airway Management Planned: Natural Airway and Nasal Cannula  Additional Equipment:   Intra-op Plan:   Post-operative Plan:   Informed Consent: I have reviewed the patients History and Physical, chart, labs and discussed the procedure including the risks, benefits and alternatives for the proposed anesthesia with the patient or authorized representative who has indicated his/her understanding and acceptance.     Dental Advisory Given  Plan Discussed with: Anesthesiologist, CRNA and Surgeon  Anesthesia Plan Comments: (Patient consented for risks of anesthesia including but not limited to:  - adverse reactions to medications - damage to eyes, teeth, lips or other oral mucosa - nerve damage due to positioning  - sore throat or hoarseness - Damage to heart, brain, nerves, lungs, other parts of body or loss of life  Patient voiced understanding and assent.)         Anesthesia Quick Evaluation

## 2024-10-10 NOTE — Discharge Instructions (Signed)

## 2024-10-12 ENCOUNTER — Other Ambulatory Visit: Payer: Self-pay

## 2024-10-12 ENCOUNTER — Ambulatory Visit: Admitting: Anesthesiology

## 2024-10-12 ENCOUNTER — Encounter: Admission: RE | Disposition: A | Payer: Self-pay

## 2024-10-12 ENCOUNTER — Ambulatory Visit: Admission: RE | Admit: 2024-10-12 | Discharge: 2024-10-12 | Disposition: A | Discharge status: Home / Self Care

## 2024-10-12 DIAGNOSIS — I10 Essential (primary) hypertension: Secondary | ICD-10-CM | POA: Insufficient documentation

## 2024-10-12 DIAGNOSIS — H2511 Age-related nuclear cataract, right eye: Secondary | ICD-10-CM | POA: Insufficient documentation

## 2024-10-12 DIAGNOSIS — Z87891 Personal history of nicotine dependence: Secondary | ICD-10-CM | POA: Insufficient documentation

## 2024-10-12 DIAGNOSIS — H25041 Posterior subcapsular polar age-related cataract, right eye: Secondary | ICD-10-CM | POA: Diagnosis present

## 2024-10-12 HISTORY — PX: CATARACT EXTRACTION W/PHACO: SHX586

## 2024-10-12 SURGERY — PHACOEMULSIFICATION, CATARACT, WITH IOL INSERTION
Anesthesia: Monitor Anesthesia Care | Site: Eye | Laterality: Right

## 2024-10-12 MED ORDER — SIGHTPATH DOSE#1 BSS IO SOLN
INTRAOCULAR | Status: DC | PRN
  Administered 2024-10-12: 09:00:00 15 mL via INTRAOCULAR

## 2024-10-12 MED ORDER — PHENYLEPHRINE HCL 10 % OP SOLN
1.0000 [drp] | OPHTHALMIC | Status: AC | PRN
  Administered 2024-10-12 (×3): 1 [drp] via OPHTHALMIC

## 2024-10-12 MED ORDER — TETRACAINE HCL 0.5 % OP SOLN
OPHTHALMIC | Status: AC
  Filled 2024-10-12: qty 4

## 2024-10-12 MED ORDER — CYCLOPENTOLATE HCL 2 % OP SOLN
OPHTHALMIC | Status: AC
  Filled 2024-10-12: qty 2

## 2024-10-12 MED ORDER — BRIMONIDINE TARTRATE-TIMOLOL 0.2-0.5 % OP SOLN
OPHTHALMIC | Status: DC | PRN
  Administered 2024-10-12: 09:00:00 1 [drp] via OPHTHALMIC

## 2024-10-12 MED ORDER — ACETAMINOPHEN 325 MG PO TABS
ORAL_TABLET | ORAL | Status: AC
  Filled 2024-10-12: qty 2

## 2024-10-12 MED ORDER — PHENYLEPHRINE HCL 10 % OP SOLN
OPHTHALMIC | Status: AC
  Filled 2024-10-12: qty 5

## 2024-10-12 MED ORDER — CYCLOPENTOLATE HCL 2 % OP SOLN
1.0000 [drp] | OPHTHALMIC | Status: AC | PRN
  Administered 2024-10-12 (×3): 1 [drp] via OPHTHALMIC

## 2024-10-12 MED ORDER — TETRACAINE 0.5 % OP SOLN OPTIME - NO CHARGE
OPHTHALMIC | Status: DC | PRN
  Administered 2024-10-12: 08:00:00 1 [drp] via OPHTHALMIC

## 2024-10-12 MED ORDER — SIGHTPATH DOSE#1 BSS IO SOLN
INTRAOCULAR | Status: DC | PRN
  Administered 2024-10-12: 09:00:00 122 mL via OPHTHALMIC

## 2024-10-12 MED ORDER — LIDOCAINE HCL (PF) 2 % IJ SOLN
INTRAOCULAR | Status: DC | PRN
  Administered 2024-10-12: 09:00:00 4 mL via INTRAOCULAR

## 2024-10-12 MED ORDER — MIDAZOLAM HCL 2 MG/2ML IJ SOLN
INTRAMUSCULAR | Status: AC
  Filled 2024-10-12: qty 2

## 2024-10-12 MED ORDER — PROPOFOL 10 MG/ML IV BOLUS
INTRAVENOUS | Status: AC
  Filled 2024-10-12: qty 20

## 2024-10-12 MED ORDER — PROPOFOL 500 MG/50ML IV EMUL
INTRAVENOUS | Status: DC | PRN
  Administered 2024-10-12: 09:00:00 40 mg via INTRAVENOUS
  Administered 2024-10-12: 09:00:00 20 mg via INTRAVENOUS
  Administered 2024-10-12 (×4): 10 mg via INTRAVENOUS

## 2024-10-12 MED ORDER — EPHEDRINE SULFATE (PRESSORS) 25 MG/5ML IV SOSY
PREFILLED_SYRINGE | INTRAVENOUS | Status: DC | PRN
  Administered 2024-10-12: 09:00:00 5 mg via INTRAVENOUS

## 2024-10-12 MED ORDER — SIGHTPATH DOSE#1 NA HYALUR & NA CHOND-NA HYALUR IO KIT
PACK | INTRAOCULAR | Status: DC | PRN
  Administered 2024-10-12: 09:00:00 1 via OPHTHALMIC

## 2024-10-12 MED ORDER — MIDAZOLAM HCL (PF) 2 MG/2ML IJ SOLN
INTRAMUSCULAR | Status: DC | PRN
  Administered 2024-10-12: 08:00:00 1 mg via INTRAVENOUS
  Administered 2024-10-12 (×2): .5 mg via INTRAVENOUS

## 2024-10-12 MED ORDER — FENTANYL CITRATE (PF) 100 MCG/2ML IJ SOLN
INTRAMUSCULAR | Status: DC | PRN
  Administered 2024-10-12 (×2): 50 ug via INTRAVENOUS

## 2024-10-12 MED ORDER — LACTATED RINGERS IV SOLN: INTRAVENOUS | Status: DC

## 2024-10-12 MED ORDER — FENTANYL CITRATE (PF) 100 MCG/2ML IJ SOLN
INTRAMUSCULAR | Status: AC
  Filled 2024-10-12: qty 2

## 2024-10-12 MED ORDER — SIGHTPATH DOSE#1 NA CHONDROIT SULF-NA HYALURON 20-15 MG/0.5ML IO SOSY
INTRAOCULAR | Status: DC | PRN
  Administered 2024-10-12: 09:00:00 .5 mL via INTRAOCULAR

## 2024-10-12 MED ORDER — EPHEDRINE 5 MG/ML INJ
INTRAVENOUS | Status: AC
  Filled 2024-10-12: qty 5

## 2024-10-12 MED ORDER — TETRACAINE HCL 0.5 % OP SOLN
1.0000 [drp] | OPHTHALMIC | Status: DC | PRN
  Administered 2024-10-12 (×3): 1 [drp] via OPHTHALMIC

## 2024-10-12 MED ORDER — MOXIFLOXACIN HCL 0.5 % OP SOLN
OPHTHALMIC | Status: DC | PRN
  Administered 2024-10-12: 09:00:00 .2 mL via OPHTHALMIC

## 2024-10-12 MED ADMIN — Acetaminophen Tab 325 MG: 650 mg | ORAL | @ 09:00:00 | NDC 50580045811

## 2024-10-12 MED FILL — Fentanyl Citrate Preservative Free (PF) Inj 100 MCG/2ML: INTRAMUSCULAR | Qty: 2 | Status: CN

## 2024-10-12 SURGICAL SUPPLY — 10 items
CANNULA HYDRODISSEC NUC 25X7/8 (MISCELLANEOUS) ×1 IMPLANT
DRSG TEGADERM 2-3/8X2-3/4 SM (GAUZE/BANDAGES/DRESSINGS) ×1 IMPLANT
FEE CATARACT SUITE SIGHTPATH (MISCELLANEOUS) ×1 IMPLANT
GLOVE BIOGEL PI IND STRL 8 (GLOVE) ×1 IMPLANT
GLOVE SURG LX STRL 7.5 STRW (GLOVE) ×1 IMPLANT
GLOVE SURG SYN 6.5 PF PI BL (GLOVE) ×1 IMPLANT
LENS IOL CLRN CLEAR 19.5 (Intraocular Lens) IMPLANT
NDL FILTER BLUNT 18X1 1/2 (NEEDLE) ×1 IMPLANT
NEEDLE FILTER BLUNT 18X1 1/2 (NEEDLE) ×1 IMPLANT
SYR 3ML LL SCALE MARK (SYRINGE) ×1 IMPLANT

## 2024-10-12 NOTE — Anesthesia Postprocedure Evaluation (Signed)
 Anesthesia Post Note  Patient: Matthew Hood  Procedure(s) Performed: PHACOEMULSIFICATION, CATARACT, WITH IOL INSERTION 12.65 01:43.7 (Right: Eye)  Patient location during evaluation: PACU Anesthesia Type: MAC Level of consciousness: awake and alert Pain management: pain level controlled Vital Signs Assessment: post-procedure vital signs reviewed and stable Respiratory status: spontaneous breathing, nonlabored ventilation, respiratory function stable and patient connected to nasal cannula oxygen Cardiovascular status: stable and blood pressure returned to baseline Postop Assessment: no apparent nausea or vomiting Anesthetic complications: no   No notable events documented.   Last Vitals:  Vitals:   10/12/24 0904 10/12/24 0915  BP: 109/70 130/66  Pulse: 73 63  Resp: 14 12  Temp: (!) 36.2 C (!) 36.2 C  SpO2: 96% 97%    Last Pain:  Vitals:   10/12/24 0913  TempSrc:   PainSc: 6                  Blayne Frankie C Maryann Mccall

## 2024-10-12 NOTE — Discharge Instructions (Signed)

## 2024-10-12 NOTE — H&P (Signed)
 [facility]   Primary Care Physician:  Rojelio Loader, MD Ophthalmologist: Dr. Curtistine Fava  Pre-Procedure History & Physical: HPI:  Matthew Hood is a 83 y.o. male here for cataract surgery right eye.   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 Authorizing Provider  ferrous sulfate 325 (65 FE) MG EC tablet Take 325 mg by mouth daily with breakfast.   Yes [provider]  Omega-3 Fatty Acids (FISH OIL) 300 MG CAPS Take by mouth.   Yes [provider]  omeprazole (PRILOSEC) 20 MG capsule Take 20 mg by mouth daily.   Yes [provider]  oxyCODONE-acetaminophen  (PERCOCET) 10-325 MG per tablet Take 1 tablet by mouth every 8 (eight) hours as needed for pain.   Yes [provider]  tamsulosin (FLOMAX) 0.4 MG CAPS capsule Take 0.4 mg by mouth.   Yes [provider]  traZODone (DESYREL) 50 MG tablet Take 25 mg by mouth at bedtime.   Yes [provider]  vitamin B-12 (CYANOCOBALAMIN) 500 MCG tablet Take 500 mcg by mouth daily.   Yes [provider]  aspirin 81 MG tablet Take 81 mg by mouth daily. Patient not taking: Reported on 07/13/2024    [provider]  Cyanocobalamin (VITAMIN B-12 IJ) Inject as directed. Patient not taking: Reported on 10/09/2024    [provider]   cyanocobalamin (VITAMIN B12) 1000 MCG/ML injection Inject 1,000 mcg into the muscle every 30 (thirty) days. Patient not taking: Reported on 10/09/2024    [provider]  naloxone [facility]) nasal spray 4 mg/0.1 mL Place 1 spray into the nose once. 03/31/23   [provider]    Allergies as of 10/03/2024 - Review Complete 07/13/2024  Allergen Reaction Noted   Narcan [naloxone hcl]  04/19/2015   Penicillins  04/19/2015    Family History  Problem Relation Age of Onset   Heart disease Mother    Heart attack Father     Social History   Socioeconomic History   Marital status: Widowed    Spouse name: Not on file   Number of children: Not on file   Years of education: Not on file   Highest education level: Not on file  Occupational History   Not on file  Tobacco Use   Smoking status: Former    Current packs/day: 0.00    Average packs/day: 2.0 packs/day for 40.0 years (80.0 ttl pk-yrs)    Types: Cigarettes    Start date: 10/25/1950    Quit date: 10/25/1990    Years since quitting: 33.9   Smokeless tobacco: Never  Vaping Use   Vaping status: Never Used  Substance and Sexual Activity   Alcohol use: Yes    Comment: occasional  Drug use: No   Sexual activity: Not on file  Other Topics Concern   Not on file  Social History Narrative   Not on file   Social Drivers of Health   Tobacco Use: Medium Risk (10/12/2024)   Patient History    Smoking Tobacco Use: Former    Smokeless Tobacco Use: Never    Passive Exposure: Not on file  Financial Resource Strain: Low Risk  (03/31/2023)   Received from [facility]   Overall Financial Resource Strain (CARDIA)    Difficulty of Paying Living Expenses: Not hard at all  Food Insecurity: No Food Insecurity (03/31/2023)   Received from [facility]   Epic    Within the past 12 months, you worried that your food would run out before you got the money to buy more.: Never true    Within the  past 12 months, the food you bought just didn't last and you didn't have money to get more.: Never true  Transportation Needs: No Transportation Needs (03/31/2023)   Received from [facility]    In the past 12 months, has lack of transportation kept you from medical appointments or from getting medications?: No    Lack of Transportation (Non-Medical): No  Physical Activity: Not on file  Stress: Not on file  Social Connections: Not on file  Intimate Partner Violence: Not on file  Depression (PHQ2-9): Low Risk (07/13/2024)   Depression (PHQ2-9)    PHQ-2 Score: 0  Alcohol Screen: Not on file  Housing: Unknown (03/02/2024)   Received from [facility]   Epic    In the last 12 months, was there a time when you were not able to pay the mortgage or rent on time?: No    Number of Times Moved in the Last Year: Not on file    At any time in the past 12 months, were you homeless or living in a shelter (including now)?: No  Utilities: Not on file  Health Literacy: Not on file    Review of Systems: See HPI, otherwise negative ROS  Physical Exam: BP (!) 147/73   Temp 97.8 F (36.6 C) (Temporal)   Resp 13   Ht 5' 6 (1.676 m)   Wt 67.3 kg   SpO2 100%   BMI 23.95 kg/m  General:   Alert, cooperative in NAD Head:  Normocephalic and atraumatic. Respiratory:  Normal work of breathing. Cardiovascular:  RRR  Impression/Plan: Matthew Hood is here for cataract surgery.  Risks, benefits, limitations, and alternatives regarding cataract surgery have been reviewed with the patient.  Questions have been answered.  All parties agreeable.   Curtistine JINNY Fava, MD  10/12/2024, 8:18 AM

## 2024-10-12 NOTE — Op Note (Signed)
 PREOPERATIVE DIAGNOSIS:  Nuclear sclerotic cataract of the right eye.   POSTOPERATIVE DIAGNOSIS:  Right Eye Cataract   OPERATIVE PROCEDURE: Phacoemulsification with IOL Implant Right Eye   SURGEON:  Curtistine Fava, MD   ANESTHESIA:  Anesthesiologist: Ola Donny BROCKS, MD CRNA: Myra Lawless, CRNA; Liliane Ports, CRNA  Monitored anesthesia care. Topical tetracaine  drops followed by 2% Xylocaine  jelly applied in the preoperative holding area 0.5ml of epi-Shugarcaine was instilled in the eye following the paracentesis.   COMPLICATIONS:  None.   TECHNIQUE:   Phacoemulsification divide and conquer   DESCRIPTION OF PROCEDURE:  The patient was examined and consented in the preoperative holding area where the aforementioned topical anesthesia was applied to the right eye and then brought back to the Operating Room where the right eye was prepped and draped in the usual sterile ophthalmic fashion and a lid speculum was placed. A paracentesis was created with the side port blade and the anterior chamber was filled with epi-Shugarcaine follower by viscoelastic. A clear corneal incision was performed with the steel keratome. A continuous curvilinear capsulorrhexis was performed with a cystotome followed by the capsulorrhexis forceps. Hydrodissection and hydrodelineation were carried out with BSS on a blunt cannula. The lens was removed in a divide and conquer technique and the remaining cortical material was removed with the irrigation-aspiration handpiece. The capsular bag was inflated with viscoelastic and the +19.50 D CC60WF lens was placed in the capsular bag without complication. The remaining viscoelastic was removed from the eye with the irrigation-aspiration handpiece. The wounds were hydrated. The anterior chamber was flushed with BSS and the eye was inflated to physiologic pressure. 0.30ml of Vigamox  was placed in the anterior chamber. The wounds were found to be water tight. The eye was dressed  with Combigan  and covered with a clear shield to be worn until the first postoperative day appointment. The patient was given protective glasses to wear throughout the day. The patient was also given drops with which to begin a drop regimen today and will follow-up with me in one day. Implant Name Type Inv. Item Serial No. Manufacturer Lot No. LRB No. Used Action  LENS IOL CLRN CLEAR 19.5 - [id number] Intraocular Lens LENS IOL CLRN CLEAR 19.5 [id number] [employer]  Right 1 Implanted   Procedures: PHACOEMULSIFICATION, CATARACT, WITH IOL INSERTION 12.65 01:43.7 (Right)  Electronically signed: Curtistine PARAS [facility] 10/12/2024 9:02 AM

## 2024-10-12 NOTE — Transfer of Care (Signed)
 Immediate Anesthesia Transfer of Care Note  Patient: Matthew Hood  Procedure(s) Performed: PHACOEMULSIFICATION, CATARACT, WITH IOL INSERTION 12.65 01:43.7 (Right: Eye)  Patient Location: PACU  Anesthesia Type: MAC  Level of Consciousness: awake, alert  and patient cooperative  Airway and Oxygen Therapy: Patient Spontanous Breathing and Patient connected to supplemental oxygen  Post-op Assessment: Post-op Vital signs reviewed, Patient's Cardiovascular Status Stable, Respiratory Function Stable, Patent Airway and No signs of Nausea or vomiting  Post-op Vital Signs: Reviewed and stable  Complications: No notable events documented.

## 2024-10-13 NOTE — Anesthesia Preprocedure Evaluation (Addendum)
"                                    Anesthesia Evaluation  Patient identified by MRN, date of birth, ID band Patient awake    Reviewed: Allergy & Precautions, H&P , NPO status , Patient's Chart, lab work & pertinent test results  Airway Mallampati: III  TM Distance: >3 FB Neck ROM: Full    Dental no notable dental hx. (+) Upper Dentures, Lower Dentures   Pulmonary former smoker   Pulmonary exam normal breath sounds clear to auscultation       Cardiovascular hypertension, Normal cardiovascular exam Rhythm:Regular Rate:Normal  01-14-15 echo INTERPRETATION  NORMAL LEFT VENTRICULAR SYSTOLIC FUNCTION WITH AN ESTIMATED EF = 50 %  NORMAL RIGHT VENTRICULAR SYSTOLIC FUNCTION  MODERATE TRICUSPID VALVE INSUFFICIENCY  MILD MITRAL VALVE INSUFFICIENCY  NO VALVULAR STENOSIS         Neuro/Psych  PSYCHIATRIC DISORDERS  Depression     Hx right MCA stroke 2015,  word finding difficulties at times  Neuromuscular disease CVA negative neurological ROS  negative psych ROS   GI/Hepatic negative GI ROS, Neg liver ROS,,,  Endo/Other  negative endocrine ROS    Renal/GU negative Renal ROS  negative genitourinary   Musculoskeletal negative musculoskeletal ROS (+) Arthritis ,    Abdominal   Peds negative pediatric ROS (+)  Hematology negative hematology ROS (+) Blood dyscrasia, anemia   Anesthesia Other Findings Previous cataract surgery 10-12-24 Dr. Ola  Hypertension BPH (benign prostatic hyperplasia) B12 deficiency Iron deficiency anemia Chronic pain Insomnia Chronic pruritus            Hereditary sensory neuropathy Depression             Low back pain without sciatica Sensory neuropathy      Small fiber neuropathy Disequilibrium syndrome        Acute ischemic right MCA stroke 2015 Mild mitral insufficiency   Reproductive/Obstetrics negative OB ROS                              Anesthesia Physical Anesthesia Plan  ASA:  3  Anesthesia Plan: MAC   Post-op Pain Management:    Induction: Intravenous  PONV Risk Score and Plan:   Airway Management Planned: Natural Airway and Nasal Cannula  Additional Equipment:   Intra-op Plan:   Post-operative Plan:   Informed Consent: I have reviewed the patients History and Physical, chart, labs and discussed the procedure including the risks, benefits and alternatives for the proposed anesthesia with the patient or authorized representative who has indicated his/her understanding and acceptance.     Dental Advisory Given  Plan Discussed with: Anesthesiologist, CRNA and Surgeon  Anesthesia Plan Comments: (Patient consented for risks of anesthesia including but not limited to:  - adverse reactions to medications - damage to eyes, teeth, lips or other oral mucosa - nerve damage due to positioning  - sore throat or hoarseness - Damage to heart, brain, nerves, lungs, other parts of body or loss of life  Patient voiced understanding and assent.)         Anesthesia Quick Evaluation  "

## 2024-10-23 ENCOUNTER — Ambulatory Visit: Payer: Self-pay | Admitting: Anesthesiology

## 2024-10-23 ENCOUNTER — Ambulatory Visit: Admission: RE | Admit: 2024-10-23 | Discharge: 2024-10-23 | Disposition: A | Discharge status: Home / Self Care

## 2024-10-23 ENCOUNTER — Other Ambulatory Visit: Payer: Self-pay

## 2024-10-23 ENCOUNTER — Encounter: Admission: RE | Disposition: A | Discharge status: Home / Self Care | Payer: Self-pay | Source: Home / Self Care

## 2024-10-23 DIAGNOSIS — Z87891 Personal history of nicotine dependence: Secondary | ICD-10-CM | POA: Diagnosis not present

## 2024-10-23 DIAGNOSIS — I69318 Other symptoms and signs involving cognitive functions following cerebral infarction: Secondary | ICD-10-CM | POA: Diagnosis not present

## 2024-10-23 DIAGNOSIS — I10 Essential (primary) hypertension: Secondary | ICD-10-CM | POA: Diagnosis not present

## 2024-10-23 DIAGNOSIS — H2512 Age-related nuclear cataract, left eye: Secondary | ICD-10-CM | POA: Diagnosis present

## 2024-10-23 DIAGNOSIS — F32A Depression, unspecified: Secondary | ICD-10-CM | POA: Diagnosis not present

## 2024-10-23 HISTORY — PX: CATARACT EXTRACTION W/PHACO: SHX586

## 2024-10-23 SURGERY — PHACOEMULSIFICATION, CATARACT, WITH IOL INSERTION
Anesthesia: Monitor Anesthesia Care | Laterality: Left

## 2024-10-23 MED ORDER — CYCLOPENTOLATE HCL 2 % OP SOLN
1.0000 [drp] | OPHTHALMIC | Status: DC | PRN
  Administered 2024-10-23 (×3): 1 [drp] via OPHTHALMIC

## 2024-10-23 MED ORDER — PROPOFOL 500 MG/50ML IV EMUL
INTRAVENOUS | Status: DC | PRN
  Administered 2024-10-23: 30 mg via INTRAVENOUS
  Administered 2024-10-23 (×2): 20 mg via INTRAVENOUS

## 2024-10-23 MED ORDER — TETRACAINE HCL 0.5 % OP SOLN
OPHTHALMIC | Status: AC
  Filled 2024-10-23: qty 4

## 2024-10-23 MED ORDER — TETRACAINE HCL 0.5 % OP SOLN
1.0000 [drp] | OPHTHALMIC | Status: DC | PRN
  Administered 2024-10-23 (×3): 1 [drp] via OPHTHALMIC

## 2024-10-23 MED ORDER — BRIMONIDINE TARTRATE-TIMOLOL 0.2-0.5 % OP SOLN
OPHTHALMIC | Status: DC | PRN
  Administered 2024-10-23: 1 [drp] via OPHTHALMIC

## 2024-10-23 MED ORDER — TETRACAINE 0.5 % OP SOLN OPTIME - NO CHARGE
OPHTHALMIC | Status: DC | PRN
  Administered 2024-10-23: 2 [drp] via OPHTHALMIC

## 2024-10-23 MED ORDER — PHENYLEPHRINE HCL 10 % OP SOLN
OPHTHALMIC | Status: AC
  Filled 2024-10-23: qty 5

## 2024-10-23 MED ORDER — DEXMEDETOMIDINE HCL IN NACL 200 MCG/50ML IV SOLN
INTRAVENOUS | Status: DC | PRN
  Administered 2024-10-23 (×3): 4 ug via INTRAVENOUS
  Administered 2024-10-23: 8 ug via INTRAVENOUS

## 2024-10-23 MED ORDER — MIDAZOLAM HCL 2 MG/2ML IJ SOLN
INTRAMUSCULAR | Status: AC
  Filled 2024-10-23: qty 2

## 2024-10-23 MED ORDER — SIGHTPATH DOSE#1 BSS IO SOLN
INTRAOCULAR | Status: DC | PRN
  Administered 2024-10-23: 109 mL via OPHTHALMIC

## 2024-10-23 MED ORDER — FENTANYL CITRATE (PF) 100 MCG/2ML IJ SOLN
INTRAMUSCULAR | Status: AC
  Filled 2024-10-23: qty 2

## 2024-10-23 MED ORDER — MIDAZOLAM HCL (PF) 2 MG/2ML IJ SOLN
INTRAMUSCULAR | Status: DC | PRN
  Administered 2024-10-23 (×2): 1 mg via INTRAVENOUS

## 2024-10-23 MED ORDER — DEXMEDETOMIDINE HCL IN NACL 80 MCG/20ML IV SOLN
INTRAVENOUS | Status: AC
  Filled 2024-10-23: qty 20

## 2024-10-23 MED ORDER — SIGHTPATH DOSE#1 NA HYALUR & NA CHOND-NA HYALUR IO KIT
PACK | INTRAOCULAR | Status: DC | PRN
  Administered 2024-10-23: 1 via OPHTHALMIC

## 2024-10-23 MED ORDER — CYCLOPENTOLATE HCL 2 % OP SOLN
OPHTHALMIC | Status: AC
  Filled 2024-10-23: qty 2

## 2024-10-23 MED ORDER — FENTANYL CITRATE (PF) 100 MCG/2ML IJ SOLN
INTRAMUSCULAR | Status: DC | PRN
  Administered 2024-10-23 (×2): 50 ug via INTRAVENOUS

## 2024-10-23 MED ORDER — LACTATED RINGERS IV SOLN: INTRAVENOUS | Status: DC

## 2024-10-23 MED ORDER — LIDOCAINE HCL (PF) 2 % IJ SOLN
INTRAOCULAR | Status: DC | PRN
  Administered 2024-10-23: 1 mL via INTRAOCULAR

## 2024-10-23 MED ORDER — PHENYLEPHRINE HCL 10 % OP SOLN
1.0000 [drp] | OPHTHALMIC | Status: DC | PRN
  Administered 2024-10-23 (×3): 1 [drp] via OPHTHALMIC

## 2024-10-23 MED ORDER — MOXIFLOXACIN HCL 0.5 % OP SOLN
OPHTHALMIC | Status: DC | PRN
  Administered 2024-10-23: .2 mL via OPHTHALMIC

## 2024-10-23 MED ORDER — PROPOFOL 10 MG/ML IV BOLUS
INTRAVENOUS | Status: AC
  Filled 2024-10-23: qty 20

## 2024-10-23 MED ORDER — SIGHTPATH DOSE#1 BSS IO SOLN
INTRAOCULAR | Status: DC | PRN
  Administered 2024-10-23: 15 mL via INTRAOCULAR

## 2024-10-23 SURGICAL SUPPLY — 9 items
CANNULA HYDRODISSEC NUC 25X7/8 (MISCELLANEOUS) ×1 IMPLANT
DRSG TEGADERM 2-3/8X2-3/4 SM (GAUZE/BANDAGES/DRESSINGS) ×1 IMPLANT
FEE CATARACT SUITE SIGHTPATH (MISCELLANEOUS) ×1 IMPLANT
GLOVE BIOGEL PI IND STRL 8 (GLOVE) ×1 IMPLANT
GLOVE SURG LX STRL 7.5 STRW (GLOVE) ×1 IMPLANT
GLOVE SURG SYN 6.5 PF PI BL (GLOVE) ×1 IMPLANT
LENS IOL CLRN CLEAR 20.0 (Intraocular Lens) IMPLANT
NEEDLE FILTER BLUNT 18X1 1/2 (NEEDLE) ×1 IMPLANT
SYR 3ML LL SCALE MARK (SYRINGE) ×1 IMPLANT

## 2024-10-23 NOTE — Anesthesia Postprocedure Evaluation (Signed)
"   Anesthesia Post Note  Patient: Matthew Hood  Procedure(s) Performed: PHACOEMULSIFICATION, CATARACT, WITH IOL INSERTION 7.87, 01:04.5 (Left)  Patient location during evaluation: PACU Anesthesia Type: MAC Level of consciousness: awake and alert Pain management: pain level controlled Vital Signs Assessment: post-procedure vital signs reviewed and stable Respiratory status: spontaneous breathing, nonlabored ventilation, respiratory function stable and patient connected to nasal cannula oxygen Cardiovascular status: stable and blood pressure returned to baseline Postop Assessment: no apparent nausea or vomiting Anesthetic complications: no   No notable events documented.   Last Vitals:  Vitals:   10/23/24 0948 10/23/24 0955  BP: 96/69 105/74  Pulse: 65 64  Resp: 20 15  Temp: 36.7 C 36.7 C  SpO2: 95% 94%    Last Pain:  Vitals:   10/23/24 0955  TempSrc:   PainSc: 0-No pain                 Darly Massi C Latoshia Monrroy      "

## 2024-10-23 NOTE — H&P (Signed)
 [facility]   Primary Care Physician:  Rojelio Loader, MD Ophthalmologist: Dr. Curtistine Fava  Pre-Procedure History & Physical: HPI:  Matthew Hood is a 83 y.o. male here for cataract surgery left eye.   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   still some word-finding difficulties at times   B12 deficiency    BPH (benign prostatic hyperplasia)    Chronic pain    Chronic pruritus    Depression    Disequilibrium syndrome    Hereditary sensory neuropathy    Hypertension    Insomnia    Iron deficiency anemia    Low back pain without sciatica    Mild mitral insufficiency 2021   Sensory neuropathy    Small fiber neuropathy     Past Surgical History:  Procedure Laterality Date   CATARACT EXTRACTION W/PHACO Right 10/12/2024   Procedure: PHACOEMULSIFICATION, CATARACT, WITH IOL INSERTION 12.65 01:43.7;  Surgeon: Fava Curtistine PARAS, MD;  Location[facility];  Service: Ophthalmology;  Laterality: Right;   CHOLECYSTECTOMY     SHOULDER ARTHROSCOPY      Prior to Admission medications  Medication Sig Start Date End Date Taking? Authorizing Provider  ferrous sulfate 325 (65 FE) MG EC tablet Take 325 mg by mouth daily with breakfast.   Yes [provider]  Omega-3 Fatty Acids (FISH OIL) 300 MG CAPS Take by mouth.   Yes [provider]  omeprazole (PRILOSEC) 20 MG capsule Take 20 mg by mouth daily.   Yes [provider]  oxyCODONE-acetaminophen  (PERCOCET) 10-325 MG per tablet Take 1 tablet by mouth every 8 (eight) hours as needed for pain.   Yes [provider]  tamsulosin (FLOMAX) 0.4 MG CAPS capsule Take 0.4 mg by mouth.   Yes [provider]  traZODone (DESYREL) 50 MG tablet Take 25 mg by mouth at bedtime.   Yes [provider]  vitamin B-12 (CYANOCOBALAMIN) 500 MCG tablet Take 500 mcg by mouth daily.   Yes [provider]  aspirin 81 MG tablet Take  81 mg by mouth daily. Patient not taking: Reported on 07/13/2024    [provider]  Cyanocobalamin (VITAMIN B-12 IJ) Inject as directed. Patient not taking: Reported on 10/09/2024    [provider]  cyanocobalamin (VITAMIN B12) 1000 MCG/ML injection Inject 1,000 mcg into the muscle every 30 (thirty) days. Patient not taking: Reported on 10/09/2024    [provider]  naloxone [facility]) nasal spray 4 mg/0.1 mL Place 1 spray into the nose once. 03/31/23   [provider]    Allergies as of 10/03/2024 - Review Complete 07/13/2024  Allergen Reaction Noted   Narcan [naloxone hcl]  04/19/2015   Penicillins  04/19/2015    Family History  Problem Relation Age of Onset   Heart disease Mother    Heart attack Father     Social History   Socioeconomic History   Marital status: Widowed    Spouse name: Not on file   Number of children: Not on file   Years of education: Not on file   Highest education level: Not on file  Occupational History   Not on file  Tobacco Use   Smoking status: Former    Current packs/day: 0.00    Average packs/day: 2.0 packs/day for 40.0 years (80.0 ttl pk-yrs)    Types: Cigarettes    Start date: 10/25/1950    Quit date: 10/25/1990  Years since quitting: 34.0   Smokeless tobacco: Never  Vaping Use   Vaping status: Never Used  Substance and Sexual Activity   Alcohol use: Yes    Comment: occasional   Drug use: No   Sexual activity: Not on file  Other Topics Concern   Not on file  Social History Narrative   Not on file   Social Drivers of Health   Tobacco Use: Medium Risk (10/23/2024)   Patient History    Smoking Tobacco Use: Former    Smokeless Tobacco Use: Never    Passive Exposure: Not on file  Financial Resource Strain: Low Risk  (03/31/2023)   Received from [facility]   Overall Financial Resource Strain (CARDIA)    Difficulty of Paying Living Expenses: Not hard at all  Food Insecurity: No  Food Insecurity (03/31/2023)   Received from [facility]   Epic    Within the past 12 months, you worried that your food would run out before you got the money to buy more.: Never true    Within the past 12 months, the food you bought just didn't last and you didn't have money to get more.: Never true  Transportation Needs: No Transportation Needs (03/31/2023)   Received from [facility]    In the past 12 months, has lack of transportation kept you from medical appointments or from getting medications?: No    Lack of Transportation (Non-Medical): No  Physical Activity: Not on file  Stress: Not on file  Social Connections: Not on file  Intimate Partner Violence: Not on file  Depression (PHQ2-9): Low Risk (07/13/2024)   Depression (PHQ2-9)    PHQ-2 Score: 0  Alcohol Screen: Not on file  Housing: Unknown (03/02/2024)   Received from [facility]   Epic    In the last 12 months, was there a time when you were not able to pay the mortgage or rent on time?: No    Number of Times Moved in the Last Year: Not on file    At any time in the past 12 months, were you homeless or living in a shelter (including now)?: No  Utilities: Not on file  Health Literacy: Not on file    Review of Systems: See HPI, otherwise negative ROS  Physical Exam: BP (!) 150/78   Pulse 61   Temp 97.8 F (36.6 C) (Temporal)   Resp 11   Ht 5' 6 (1.676 m)   Wt 68.6 kg   SpO2 99%   BMI 24.40 kg/m  General:   Alert, cooperative in NAD Head:  Normocephalic and atraumatic. Respiratory:  Normal work of breathing. Cardiovascular:  RRR  Impression/Plan: Matthew Hood is here for cataract surgery.  Risks, benefits, limitations, and alternatives regarding cataract surgery have been reviewed with the patient.  Questions have been answered.  All parties agreeable.   Curtistine JINNY Fava, MD  10/23/2024, 9:06 AM

## 2024-10-23 NOTE — Op Note (Signed)
 PREOPERATIVE DIAGNOSIS:  Nuclear sclerotic cataract of the left eye.   POSTOPERATIVE DIAGNOSIS:  Nuclear sclerotic cataract of the left eye.   OPERATIVE PROCEDURE: Phacoemulsification with IOL Implant Left Eye   SURGEON:  Curtistine Fava, MD   ANESTHESIA:  Anesthesiologist: Ola Donny BROCKS, MD CRNA: Jahoo, Sonia, CRNA  1.      Managed anesthesia care. 2.     0.107ml of epi-Shugarcaine was instilled following the paracentesis   COMPLICATIONS:  None.   TECHNIQUE:   Phacoemulsification divide and conquer   DESCRIPTION OF PROCEDURE:  The patient was examined and consented in the preoperative holding area where the aforementioned topical anesthesia was applied to the left eye and then brought back to the Operating Room where the left eye was prepped and draped in the usual sterile ophthalmic fashion and a lid speculum was placed. A paracentesis was created with the side port blade and the anterior chamber was filled with epi-Shugarcaine follower by viscoelastic. A clear corneal incision was performed with the steel keratome. A continuous curvilinear capsulorrhexis was performed with a cystotome followed by the capsulorrhexis forceps. Hydrodissection and hydrodelineation were carried out with BSS on a blunt cannula. The lens was removed in a divide and conquer technique and the remaining cortical material was removed with the irrigation-aspiration handpiece. The capsular bag was inflated with viscoelastic and the +20.0 CC60WF lens was placed in the capsular bag without complication. The remaining viscoelastic was removed from the eye with the irrigation-aspiration handpiece. The wounds were hydrated. The anterior chamber was flushed with BSS and the eye was inflated to physiologic pressure. 0.17ml of Vigamox  was placed in the anterior chamber. The wounds were found to be water tight. The eye was dressed with Combigan  and covered with a clear shield to be worn until the first postoperative day appointment.  The patient was given protective glasses to wear throughout the day. The patient was also given drops with which to begin a drop regimen today and will follow-up with me in one day. * No implants in log *  Procedures: PHACOEMULSIFICATION, CATARACT, WITH IOL INSERTION 7.87, 01:04.5 (Left)  Electronically signed: Curtistine PARAS [facility] 10/23/2024 9:48 AM

## 2024-10-23 NOTE — Transfer of Care (Signed)
 Immediate Anesthesia Transfer of Care Note  Patient: Matthew Hood  Procedure(s) Performed: PHACOEMULSIFICATION, CATARACT, WITH IOL INSERTION 7.87, 01:04.5 (Left)  Patient Location: PACU  Anesthesia Type: MAC  Level of Consciousness: awake, alert  and patient cooperative  Airway and Oxygen Therapy: Patient Spontanous Breathing and Patient connected to supplemental oxygen  Post-op Assessment: Post-op Vital signs reviewed, Patient's Cardiovascular Status Stable, Respiratory Function Stable, Patent Airway and No signs of Nausea or vomiting  Post-op Vital Signs: Reviewed and stable  Complications: No notable events documented.
